# Patient Record
Sex: Male | Born: 1995 | Race: White | Hispanic: No | Marital: Single | Smoking: Never smoker
Health system: Southern US, Community
[De-identification: ages and names within clinical notes are randomized; demographics above are authoritative.]

---
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
## 2019-02-28 DIAGNOSIS — Z20828 Contact with and (suspected) exposure to other viral communicable diseases: Secondary | ICD-10-CM | POA: Diagnosis not present

## 2019-08-23 DIAGNOSIS — B349 Viral infection, unspecified: Secondary | ICD-10-CM | POA: Diagnosis not present

## 2019-08-23 DIAGNOSIS — Z03818 Encounter for observation for suspected exposure to other biological agents ruled out: Secondary | ICD-10-CM | POA: Diagnosis not present

## 2019-08-23 DIAGNOSIS — R509 Fever, unspecified: Secondary | ICD-10-CM | POA: Diagnosis not present

## 2019-08-23 DIAGNOSIS — R05 Cough: Secondary | ICD-10-CM | POA: Diagnosis not present

## 2020-01-20 DIAGNOSIS — J22 Unspecified acute lower respiratory infection: Secondary | ICD-10-CM | POA: Diagnosis not present

## 2020-01-20 DIAGNOSIS — Z03818 Encounter for observation for suspected exposure to other biological agents ruled out: Secondary | ICD-10-CM | POA: Diagnosis not present

## 2020-01-20 DIAGNOSIS — J019 Acute sinusitis, unspecified: Secondary | ICD-10-CM | POA: Diagnosis not present

## 2020-01-20 DIAGNOSIS — R059 Cough, unspecified: Secondary | ICD-10-CM | POA: Diagnosis not present

## 2020-01-20 DIAGNOSIS — J029 Acute pharyngitis, unspecified: Secondary | ICD-10-CM | POA: Diagnosis not present

## 2020-03-10 ENCOUNTER — Other Ambulatory Visit: Payer: Self-pay

## 2020-03-10 ENCOUNTER — Other Ambulatory Visit: Payer: BC Managed Care – PPO

## 2020-03-10 DIAGNOSIS — Z20822 Contact with and (suspected) exposure to covid-19: Secondary | ICD-10-CM

## 2020-03-14 LAB — NOVEL CORONAVIRUS, NAA: SARS-CoV-2, NAA: NOT DETECTED

## 2022-07-26 ENCOUNTER — Ambulatory Visit (INDEPENDENT_AMBULATORY_CARE_PROVIDER_SITE_OTHER): Payer: Commercial Managed Care - PPO | Admitting: Family

## 2022-07-26 ENCOUNTER — Encounter: Payer: Self-pay | Admitting: Family

## 2022-07-26 VITALS — BP 108/64 | HR 80 | Ht 68.0 in | Wt 143.6 lb

## 2022-07-26 DIAGNOSIS — R5383 Other fatigue: Secondary | ICD-10-CM | POA: Diagnosis not present

## 2022-07-26 DIAGNOSIS — Z13220 Encounter for screening for lipoid disorders: Secondary | ICD-10-CM

## 2022-07-26 DIAGNOSIS — F909 Attention-deficit hyperactivity disorder, unspecified type: Secondary | ICD-10-CM

## 2022-07-26 LAB — LIPID PANEL
Cholesterol: 153 mg/dL (ref 0–200)
HDL: 53.3 mg/dL (ref >39.00)
LDL Cholesterol: 89 mg/dL (ref 0–99)
NonHDL: 100.1
Total CHOL/HDL Ratio: 3
Triglycerides: 56 mg/dL (ref 0.0–149.0)
VLDL: 11.2 mg/dL (ref 0.0–40.0)

## 2022-07-26 LAB — COMPREHENSIVE METABOLIC PANEL
ALT: 30 U/L (ref 0–53)
AST: 20 U/L (ref 0–37)
Albumin: 4.4 g/dL (ref 3.5–5.2)
Alkaline Phosphatase: 67 U/L (ref 39–117)
BUN: 15 mg/dL (ref 6–23)
CO2: 30 mEq/L (ref 19–32)
Calcium: 9.6 mg/dL (ref 8.4–10.5)
Chloride: 103 mEq/L (ref 96–112)
Creatinine, Ser: 0.96 mg/dL (ref 0.40–1.50)
GFR: 110.21 mL/min (ref >60.00)
Glucose, Bld: 76 mg/dL (ref 70–99)
Potassium: 4.5 mEq/L (ref 3.5–5.1)
Sodium: 141 mEq/L (ref 135–145)
Total Bilirubin: 0.9 mg/dL (ref 0.2–1.2)
Total Protein: 6.5 g/dL (ref 6.0–8.3)

## 2022-07-26 LAB — CBC WITH DIFFERENTIAL/PLATELET
Basophils Absolute: 0 10*3/uL (ref 0.0–0.1)
Basophils Relative: 0.7 % (ref 0.0–3.0)
Eosinophils Absolute: 0.2 10*3/uL (ref 0.0–0.7)
Eosinophils Relative: 3.5 % (ref 0.0–5.0)
HCT: 48.4 % (ref 39.0–52.0)
Hemoglobin: 15.8 g/dL (ref 13.0–17.0)
Lymphocytes Relative: 21.1 % (ref 12.0–46.0)
Lymphs Abs: 1.2 10*3/uL (ref 0.7–4.0)
MCHC: 32.6 g/dL (ref 30.0–36.0)
MCV: 92.8 fl (ref 78.0–100.0)
Monocytes Absolute: 0.3 10*3/uL (ref 0.1–1.0)
Monocytes Relative: 6.2 % (ref 3.0–12.0)
Neutro Abs: 3.8 10*3/uL (ref 1.4–7.7)
Neutrophils Relative %: 68.5 % (ref 43.0–77.0)
Platelets: 224 10*3/uL (ref 150.0–400.0)
RBC: 5.22 Mil/uL (ref 4.22–5.81)
RDW: 12.8 % (ref 11.5–15.5)
WBC: 5.5 10*3/uL (ref 4.0–10.5)

## 2022-07-26 LAB — TSH: TSH: 1.77 u[IU]/mL (ref 0.35–5.50)

## 2022-07-26 NOTE — Progress Notes (Signed)
  Philip Beard is a 27 y.o. male with the following history as recorded in [employer]:  There are no problems to display for this patient.   No current outpatient medications on file.   No current facility-administered medications for this visit.    Allergies: Patient has no known allergies.  No past medical history on file.  The histories are not reviewed yet. Please review them in the "History" navigator section and refresh this SmartLink.  No family history on file.  Social History   Tobacco Use   Smoking status: Never    Passive exposure: Never   Smokeless tobacco: Never  Substance Use Topics   Alcohol use: Not on file    Subjective:   Presents today as a new patient; is requesting referral to address concerns for social anxiety/ requesting to be screened for possible sound processing disorder; per patient, he has been diagnosed with ADHD- stopped medication in middle school due to side effects; able to graduate from high school with no difficulty; currently working full-time/ trade school- no accidents at work;    Objective:  Vitals:   07/26/22 0838  BP: 108/64  Pulse: 80  SpO2: 98%  Weight: 143 lb 9.6 oz (65.1 kg)  Height: 5\' 8"  (1.727 m)    General: Well developed, well nourished, in no acute distress  Skin : Warm and dry.  Head: Normocephalic and atraumatic  Eyes: Sclera and conjunctiva clear; pupils round and reactive to light; extraocular movements intact  Lungs: Respirations unlabored; clear to auscultation bilaterally without wheeze, rales, rhonchi  CVS exam: normal rate and regular rhythm.  Neurologic: Alert and oriented; speech intact; face symmetrical; moves all extremities well; CNII-XII intact without focal deficit   Assessment:  1. Attention deficit hyperactivity disorder (ADHD), unspecified ADHD type   2. Other fatigue   3. Lipid screening     Plan:  Refer back to psychiatrist; referral updated; Check CBC, CMP, TSH; Check lipid panel;   No  follow-ups on file.  Orders Placed This Encounter  Procedures   CBC with Differential/Platelet   Comp Met (CMET)   TSH   Lipid panel   Ambulatory referral to Psychiatry    Referral Priority:   Routine    Referral Type:   Psychiatric    Referral Reason:   Specialty Services Required    Requested Specialty:   Psychiatry    Number of Visits Requested:   1    Requested Prescriptions    No prescriptions requested or ordered in this encounter

## 2022-09-09 ENCOUNTER — Ambulatory Visit (HOSPITAL_COMMUNITY): Payer: Self-pay | Admitting: Psychiatry

## 2022-10-04 ENCOUNTER — Ambulatory Visit (INDEPENDENT_AMBULATORY_CARE_PROVIDER_SITE_OTHER): Payer: Commercial Managed Care - PPO | Admitting: Psychiatry

## 2022-10-04 ENCOUNTER — Encounter (HOSPITAL_COMMUNITY): Payer: Self-pay | Admitting: Psychiatry

## 2022-10-04 DIAGNOSIS — F1290 Cannabis use, unspecified, uncomplicated: Secondary | ICD-10-CM | POA: Diagnosis not present

## 2022-10-04 DIAGNOSIS — F4010 Social phobia, unspecified: Secondary | ICD-10-CM | POA: Diagnosis not present

## 2022-10-04 MED ORDER — CITALOPRAM HYDROBROMIDE 10 MG PO TABS: 10.0000 mg | ORAL_TABLET | Freq: Every day | ORAL | 0 refills | Status: AC

## 2022-10-04 NOTE — Progress Notes (Signed)
Psychiatric Initial Adult Assessment   Patient Identification: Philip Beard MRN:  [id number] Date of Evaluation:  10/04/2022 Referral Source: primary care Chief Complaint:   Chief Complaint  Patient presents with   Anxiety   Establish Care   Visit Diagnosis:    ICD-10-CM   1. Social anxiety disorder  F40.10     2. 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  Location: Patient: parked car Provider: home office    I discussed the limitations of evaluation and management by telemedicine and the availability of in person appointments. The patient expressed understanding and agreed to proceed.        I discussed the assessment and treatment plan with the patient. The patient was provided an opportunity to ask questions and all were answered. The patient agreed with the plan and demonstrated an understanding of the instructions.   The patient was advised to call back or seek an in-person evaluation if the symptoms worsen or if the condition fails to improve as anticipated.  I provided 60 minutes of non-face-to-face time during this encounter.   History of Present Illness: Patient is a 27 years old currently single Caucasian male who was referred by primary care physician to establish care for possible social anxiety with history of ADHD when younger.  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  Presents with a history of anxiety since young age of having only a few friends but denies any abuse or any particular concern when growing up .  He was diagnosed with ADHD when he was younger in middle school and was on medication but did not take it regularly.  He did finish school and college with no medication on board only had a  few friends did not any past psych admission or episodes of significant depression  He presents with mostly procrastination at times he delays his chores to be done with still there and to get his laundry done or close to by.  He avoids social interaction he avoids going to places in which there may be people or he feels that people may judge him and he feels uncomfortable in crowds as if he is going to be judged and mostly remains quiet or withdrawn in certain situations like that and avoid socializing  He does have a girlfriend who is supportive she takes him out to do some outdoor activities which she does like and that company  Does not endorse depression on a day-to-day basis does not endorse decreased interest in things he feels that depression wise he is doing reasonable.  There is no associated psychotic symptoms delusions hallucinations or paranoia there is no clear manic symptoms  He does worry but he describes his worries not to be excessive except a part related to social anxiety and gets conscious around people  He feels he is sound sensitive he feels sounds can be high intensity although even if it is low at times  He does use marijuana on a daily basis.  States it helps him sleep or he is just taking it regularly because he is used to it  He sporadically uses alcohol or in situations in which he is socializing  Aggravating factors; social anxiety  Modifying factors girlfriend,  outdoor activities  Duration since young age  Severity anxious  Past admission or suicide attempt denies    Past Psychiatric History: AdHD  Previous Psychotropic Medications: Yes   Substance Abuse History in the last 12 months:  Yes.    Consequences of Substance Abuse: Effect of THC and alcohol discussed to motivation, sound perception, paranoia, judjement  Past Medical History: History reviewed. No pertinent past medical history. History reviewed. No pertinent surgical history.  Family  Psychiatric History: denies or not know  Family History: History reviewed. No pertinent family history.  Social History:   Social History   Socioeconomic History   Marital status: Single    Spouse name: Not on file   Number of children: Not on file   Years of education: Not on file   Highest education level: Not on file  Occupational History   Not on file  Tobacco Use   Smoking status: Never    Passive exposure: Never   Smokeless tobacco: Never  Substance and Sexual Activity   Alcohol use: Not on file   Drug use: Not on file   Sexual activity: Not on file  Other Topics Concern   Not on file  Social History Narrative   Not on file   Social Determinants of Health   Financial Resource Strain: Not on file  Food Insecurity: Not on file  Transportation Needs: Not on file  Physical Activity: Not on file  Stress: Not on file  Social Connections: Not on file    Additional Social History: grew up with parents, denies abuse. Had ADHD when younger. Few friends  Allergies:  No Known Allergies  Metabolic Disorder Labs: No results found for: "HGBA1C", "MPG" No results found for: "PROLACTIN" Lab Results  Component Value Date   CHOL 153 07/26/2022   TRIG 56.0 07/26/2022   HDL 53.30 07/26/2022   CHOLHDL 3 07/26/2022   VLDL 11.2 07/26/2022   LDLCALC 89 07/26/2022   Lab Results  Component Value Date   TSH 1.77 07/26/2022    Therapeutic Level Labs: No results found for: "LITHIUM" No results found for: "CBMZ" No results found for: "VALPROATE"  Current Medications: Current Outpatient Medications  Medication Sig Dispense Refill   citalopram (CELEXA) 10 MG tablet Take 1 tablet (10 mg total) by mouth daily. 30 tablet 0   No current facility-administered medications for this visit.     Psychiatric Specialty Exam: Review of Systems  Cardiovascular:  Negative for chest pain.  Psychiatric/Behavioral:  Positive for decreased concentration. Negative for self-injury. The  patient is nervous/anxious.     There were no vitals taken for this visit.There is no height or weight on file to calculate BMI.  General Appearance: Casual  Eye Contact:  Fair  Speech:  Slow  Volume:  Decreased  Mood:   somewhat anxious  Affect:  Constricted  Thought Process:  Goal Directed  Orientation:  Full (Time, Place, and Person)  Thought Content:  Rumination  Suicidal Thoughts:  No  Homicidal Thoughts:  No  Memory:  Immediate;   Fair  Judgement:  Fair  Insight:  Shallow  Psychomotor Activity:  normal  Concentration:  Concentration: Fair  Recall:  [employer] of Knowledge:Good  Language: Fair  Akathisia:  No  Handed:    AIMS (if indicated):  not done  Assets:  Physical Health  ADL's:  Intact  Cognition: WNL  Sleep:  Fair   Screenings: [employer] [facility] Office Visit from 10/04/2022 in [facility] at [facility] Office Visit from 07/26/2022 in [facility] at [facility]  PHQ-2 Total Score 1 0      Flowsheet Row Office Visit from 10/04/2022 in [facility] at [facility]  C-SSRS RISK CATEGORY No Risk       Assessment and Plan: as follows  Social anxiety disorder; highly recommend therapy to work on that considering he has had anxiety condition since young age to process it and to work on Pharmacologist.  He understands it will take time to adjust or get better and regarding to that aspect we will start citalopram 10 mg small dose reviewed side effects for social anxiety disorder and possible for underlying anxiety condition  THC use he understands to quit any we did discuss that it may also affect some perception or cause paranoia or a motivation or making social anxiety worse he understands to do his motivation to abstain from marijuana and that [facility] also can obtain medication to work  Reviewed sleep hygiene  Does not endorse hopelessness or suicidal  thoughts  Will work on anxiety and anxiety gets better we will evaluate his procrastination or delaying his chores if it was related anxiety or separate part of inattention.  He does understand that ADHD medication may make his anxiety worse  Reviewed questions questions addressed follow-up in 3 to 4 weeks or earlier if needed highly recommend he call our office to schedule therapy    Collaboration of Care: Primary Care Provider AEB notes and chart reviewed  Patient/Guardian was advised Release of Information must be obtained prior to any record release in order to collaborate their care with an outside provider. Patient/Guardian was advised if they have not already done so to contact the registration department to sign all necessary forms in order for [place] to release information regarding their care.   Consent: Patient/Guardian gives verbal consent for treatment and assignment of benefits for services provided during this visit. Patient/Guardian expressed understanding and agreed to proceed.   Thresa Ross, MD 8/2/202411:23 AM

## 2022-11-06 ENCOUNTER — Telehealth (HOSPITAL_COMMUNITY): Payer: Commercial Managed Care - PPO | Admitting: Psychiatry

## 2022-11-06 ENCOUNTER — Encounter (HOSPITAL_COMMUNITY): Payer: Self-pay
